# Patient Record
Sex: Male | Born: 1995 | Race: Black or African American | Hispanic: No | Marital: Single | Smoking: Never smoker
Health system: Southern US, Community
[De-identification: ages and names within clinical notes are randomized; demographics above are authoritative.]

---
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
## 2016-01-03 ENCOUNTER — Emergency Department
Admission: EM | Admit: 2016-01-03 | Discharge: 2016-01-03 | Disposition: A | Discharge status: Home / Self Care | Payer: Managed Care, Other (non HMO) | Attending: Emergency Medicine | Admitting: Emergency Medicine

## 2016-01-03 ENCOUNTER — Encounter: Payer: Self-pay | Admitting: Emergency Medicine

## 2016-01-03 DIAGNOSIS — Y92410 Unspecified street and highway as the place of occurrence of the external cause: Secondary | ICD-10-CM | POA: Diagnosis not present

## 2016-01-03 DIAGNOSIS — Y9389 Activity, other specified: Secondary | ICD-10-CM | POA: Diagnosis not present

## 2016-01-03 DIAGNOSIS — M791 Myalgia: Secondary | ICD-10-CM | POA: Diagnosis not present

## 2016-01-03 DIAGNOSIS — Y999 Unspecified external cause status: Secondary | ICD-10-CM | POA: Diagnosis not present

## 2016-01-03 DIAGNOSIS — M7918 Myalgia, other site: Secondary | ICD-10-CM

## 2016-01-03 MED ORDER — IBUPROFEN 800 MG PO TABS: 800.0000 mg | ORAL_TABLET | Freq: Three times a day (TID) | ORAL | 0 refills | Status: DC | PRN

## 2016-01-03 MED ORDER — IBUPROFEN 800 MG PO TABS: 800.0000 mg | ORAL_TABLET | Freq: Three times a day (TID) | ORAL | 0 refills | Status: AC | PRN
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
## 2016-01-03 NOTE — Discharge Instructions (Signed)
Please seek medical attention for any high fevers, chest pain, shortness of breath, change in behavior, persistent vomiting, bloody stool or any other new or concerning symptoms.  
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
## 2023-08-25 ENCOUNTER — Encounter: Payer: Self-pay | Admitting: *Deleted

## 2023-08-25 ENCOUNTER — Other Ambulatory Visit: Payer: Self-pay

## 2023-08-25 DIAGNOSIS — J340 Abscess, furuncle and carbuncle of nose: Secondary | ICD-10-CM | POA: Insufficient documentation

## 2023-08-25 DIAGNOSIS — L720 Epidermal cyst: Secondary | ICD-10-CM | POA: Insufficient documentation

## 2023-08-25 NOTE — ED Triage Notes (Signed)
 Pt ambulatory to triage.  Pt has swollen area to right side of nose.  Sx for 3 years.   Now swelling and pain worse past few days.  Pt alert  speech clear.

## 2023-08-26 ENCOUNTER — Emergency Department
Admission: EM | Admit: 2023-08-26 | Discharge: 2023-08-26 | Disposition: A | Discharge status: Home / Self Care | Payer: Self-pay | Attending: Emergency Medicine | Admitting: Emergency Medicine

## 2023-08-26 DIAGNOSIS — L089 Local infection of the skin and subcutaneous tissue, unspecified: Secondary | ICD-10-CM

## 2023-08-26 MED ORDER — CEPHALEXIN 500 MG PO CAPS
500.0000 mg | ORAL_CAPSULE | Freq: Once | ORAL | Status: AC
  Administered 2023-08-26: 500 mg via ORAL
  Filled 2023-08-26: qty 1

## 2023-08-26 MED ORDER — LIDOCAINE HCL (PF) 1 % IJ SOLN
5.0000 mL | Freq: Once | INTRAMUSCULAR | Status: AC
  Administered 2023-08-26: 5 mL via INTRADERMAL
  Filled 2023-08-26: qty 5

## 2023-08-26 MED ORDER — BACITRACIN ZINC 500 UNIT/GM EX OINT
TOPICAL_OINTMENT | Freq: Once | CUTANEOUS | Status: AC
  Administered 2023-08-26: 1 via TOPICAL
  Filled 2023-08-26: qty 0.9

## 2023-08-26 MED ORDER — CEPHALEXIN 500 MG PO CAPS: 500.0000 mg | ORAL_CAPSULE | Freq: Four times a day (QID) | ORAL | 0 refills | Status: AC

## 2023-08-26 MED ORDER — IBUPROFEN 800 MG PO TABS
800.0000 mg | ORAL_TABLET | Freq: Once | ORAL | Status: AC
  Administered 2023-08-26: 800 mg via ORAL
  Filled 2023-08-26: qty 1

## 2023-08-26 NOTE — Discharge Instructions (Signed)
You may alternate Tylenol 1000 mg every 6 hours as needed for pain, fever and Ibuprofen 800 mg every 6-8 hours as needed for pain, fever.  Please take Ibuprofen with food.  Do not take more than 4000 mg of Tylenol (acetaminophen) in a 24 hour period. ° °

## 2023-08-26 NOTE — ED Provider Notes (Signed)
 [facility] Provider Note    Event Date/Time   First MD Initiated Contact with Patient 08/26/23 0025     (approximate)   History   Abscess   HPI  ARSHDEEP BOLGER is a 28 y.o. male with no significant past medical history who presents to the emergency department with an abscess to the right nose just medial to the eye.  States that he has always had an area of swelling here for the past 3 years but over the past couple days it became more swollen, painful.  It has not drained.  No fever.   History provided by patient, family.    History reviewed. No pertinent past medical history.  History reviewed. No pertinent surgical history.  MEDICATIONS:  Prior to Admission medications   Medication Sig Start Date End Date Taking? Authorizing Provider  ibuprofen  (ADVIL ,MOTRIN ) 800 MG tablet Take 1 tablet (800 mg total) by mouth every 8 (eight) hours as needed. 01/03/16   Marylynn Soho, MD    Physical Exam   Triage Vital Signs: ED Triage Vitals  Encounter Vitals Group     BP 08/25/23 2217 (!) 144/110     Systolic BP Percentile --      Diastolic BP Percentile --      Pulse Rate 08/25/23 2217 94     Resp 08/25/23 2217 18     Temp 08/25/23 2217 98.3 F (36.8 C)     Temp Source 08/25/23 2217 Oral     SpO2 08/25/23 2217 98 %     Weight 08/25/23 2218 205 lb (93 kg)     Height 08/25/23 2218 6' (1.829 m)     Head Circumference --      Peak Flow --      Pain Score 08/25/23 2218 8     Pain Loc --      Pain Education --      Exclude from Growth Chart --     Most recent vital signs: Vitals:   08/25/23 2217 08/26/23 0203  BP: (!) 144/110 124/88  Pulse: 94   Resp: 18   Temp: 98.3 F (36.8 C)   SpO2: 98%      CONSTITUTIONAL: Alert and responds appropriately to questions. Well-appearing; well-nourished HEAD: Normocephalic, atraumatic EYES: Conjunctivae clear, pupils appear equal, extraocular movements intact, no chemosis, no hyphema or  hypopyon ENT: normal nose; moist mucous membranes, patient has a slightly fluctuant and tender area to the superior right lateral portion of the nose just medial to the medial canthus of the eye with no surrounding redness or warmth.  No induration. NECK: Normal range of motion CARD: Regular rate and rhythm RESP: Normal chest excursion without splinting or tachypnea; no hypoxia or respiratory distress, speaking full sentences ABD/GI: non-distended EXT: Normal ROM in all joints, no major deformities noted SKIN: Normal color for age and race, no rashes on exposed skin NEURO: Moves all extremities equally, normal speech, no facial asymmetry noted PSYCH: The patient's mood and manner are appropriate. Grooming and personal hygiene are appropriate.  ED Results / Procedures / Treatments   LABS: (all labs ordered are listed, but only abnormal results are displayed) Labs Reviewed  AEROBIC CULTURE W GRAM STAIN (SUPERFICIAL SPECIMEN)     EKG:  EKG Interpretation Date/Time:    Ventricular Rate:    PR Interval:    QRS Duration:    QT Interval:    QTC Calculation:   R Axis:      Text Interpretation:  RADIOLOGY: My personal review and interpretation of imaging:    I have personally reviewed all radiology reports. No results found.   PROCEDURES:  Critical Care performed: No   INCISION AND DRAINAGE Performed by: Starling Eck Milanie Rosenfield Consent: Verbal consent obtained. Risks and benefits: risks, benefits and alternatives were discussed Type: abscess  Body area: Right nose  Anesthesia: local infiltration  Incision was made with a scalpel.  Local anesthetic: lidocaine 1% without epinephrine  Anesthetic total: 3 ml  Complexity: Simple  Drainage: purulent and sebaceous  Drainage amount: Small  Packing material: None  Patient tolerance: Patient tolerated the procedure well with no immediate complications.    Procedures    IMPRESSION / MDM / ASSESSMENT AND  PLAN / ED COURSE  I reviewed the triage vital signs and the nursing notes.   Patient here with what appears to be an infected epidermoid cyst.    DIFFERENTIAL DIAGNOSIS (includes but not limited to):   Infected epidermoid cyst, abscess, lipoma, doubt pre or postseptal cellulitis based on exam  Patient's presentation is most consistent with acute complicated illness / injury requiring diagnostic workup.  PLAN: Patient agrees to incision and drainage.  Will give ibuprofen , Keflex.   MEDICATIONS GIVEN IN ED: Medications  ibuprofen  (ADVIL ) tablet 800 mg (800 mg Oral Given 08/26/23 0059)  cephALEXin (KEFLEX) capsule 500 mg (500 mg Oral Given 08/26/23 0059)  lidocaine (PF) (XYLOCAINE) 1 % injection 5 mL (5 mLs Intradermal Given 08/26/23 0059)  bacitracin ointment (1 Application Topical Given 08/26/23 0157)     ED COURSE: Area was anesthetized using lidocaine which significantly helped with his discomfort.  Once his pain was controlled his blood pressure improved.  I attempted to drain this area with needle aspiration only but was only able to remove a small amount of purulent drainage.  He then agreed to a small stab incision to drain this further and then got out a small to moderate amount of sebaceous appearing material.  I did discuss with patient that he will need to follow-up with dermatology as epidermoid cyst could return.  Will discharge on antibiotics given proximity to the eye.  Clinically I am not concerned for pre or postseptal cellulitis but did discuss at length return precautions.  Recommended Tylenol, Motrin  as needed for pain control.   At this time, I do not feel there is any life-threatening condition present. I reviewed all nursing notes, vitals, pertinent previous records.  All lab and urine results, EKGs, imaging ordered have been independently reviewed and interpreted by myself.  I reviewed all available radiology reports from any imaging ordered this visit.  Based on  my assessment, I feel the patient is safe to be discharged home without further emergent workup and can continue workup as an outpatient as needed. Discussed all findings, treatment plan as well as usual and customary return precautions.  They verbalize understanding and are comfortable with this plan.  Outpatient follow-up has been provided as needed.  All questions have been answered.    CONSULTS:  none   OUTSIDE RECORDS REVIEWED: No prior records for review.     FINAL CLINICAL IMPRESSION(S) / ED DIAGNOSES   Final diagnoses:  Infected epidermoid cyst     Rx / DC Orders   ED Discharge Orders          Ordered    cephALEXin (KEFLEX) 500 MG capsule  4 times daily        08/26/23 0150    Ambulatory Referral to Primary Care [facility])  08/26/23 0151             Note:  This document was prepared using Dragon voice recognition software and may include unintentional dictation errors.   Broox Lonigro, Clover Dao, DO 08/26/23 [id number]

## 2023-08-29 LAB — AEROBIC CULTURE W GRAM STAIN (SUPERFICIAL SPECIMEN)
